# Patient Record
Sex: Male | Born: 1990 | Race: Black or African American | Hispanic: No | Marital: Single | Smoking: Never smoker
Health system: Southern US, Community
[De-identification: ages and names within clinical notes are randomized; demographics above are authoritative.]

---

## 2020-01-20 ENCOUNTER — Other Ambulatory Visit: Payer: Self-pay

## 2020-01-20 ENCOUNTER — Emergency Department: Payer: BLUE CROSS/BLUE SHIELD

## 2020-01-20 ENCOUNTER — Emergency Department
Admission: EM | Admit: 2020-01-20 | Discharge: 2020-01-20 | Disposition: A | Discharge status: Home / Self Care | Payer: BLUE CROSS/BLUE SHIELD | Attending: Emergency Medicine | Admitting: Emergency Medicine

## 2020-01-20 DIAGNOSIS — M5412 Radiculopathy, cervical region: Secondary | ICD-10-CM | POA: Diagnosis not present

## 2020-01-20 DIAGNOSIS — M25511 Pain in right shoulder: Secondary | ICD-10-CM | POA: Insufficient documentation

## 2020-01-20 MED ORDER — CYCLOBENZAPRINE HCL 10 MG PO TABS: 10.0000 mg | ORAL_TABLET | Freq: Three times a day (TID) | ORAL | 0 refills | Status: AC | PRN

## 2020-01-20 MED ORDER — METHYLPREDNISOLONE 4 MG PO TBPK: ORAL_TABLET | ORAL | 0 refills | Status: AC

## 2020-01-20 MED ORDER — LIDOCAINE 5 % EX PTCH
1.0000 | MEDICATED_PATCH | CUTANEOUS | Status: DC
  Administered 2020-01-20: 1 via TRANSDERMAL
  Filled 2020-01-20: qty 1

## 2020-01-20 MED ORDER — LIDOCAINE 5 % EX PTCH: 1.0000 | MEDICATED_PATCH | Freq: Two times a day (BID) | CUTANEOUS | 0 refills | Status: AC

## 2020-01-20 NOTE — ED Notes (Addendum)
See triage note, pt reports pain/numbness that is intermittent in right shoulder for past 2 days. Drives trucks for work. No other deficits noted. Pt in NAD at this time

## 2020-01-20 NOTE — ED Triage Notes (Signed)
Pt states he injured his right a shoulder/arm while at work 2 days ago.

## 2020-01-20 NOTE — ED Provider Notes (Signed)
[facility] Emergency Department Provider Note   ____________________________________________   First MD Initiated Contact with Patient 01/20/20 1501     (approximate)  I have reviewed the triage vital signs and the nursing notes.   HISTORY  Chief Complaint Shoulder Pain    HPI Carlos Holt is a 29 y.o. male patient complaining of right shoulder pain secondary to repetitive motion while at work 2 days ago.  Patient also stated intermitting numbness and tingling is to the right upper extremity.  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  Patient is right-hand dominant.  Patient rates his pain a 7/10.  Patient described the pain is "achy".  Patient points to the superior/ posterior aspect of the [facility] joint as a source of pain.          History reviewed. No pertinent past medical history.  There are no problems to display for this patient.   History reviewed. No pertinent surgical history.  Prior to Admission medications   Medication Sig Start Date End Date Taking? Authorizing Provider  cyclobenzaprine (FLEXERIL) 10 MG tablet Take 1 tablet (10 mg total) by mouth 3 (three) times daily as needed. 01/20/20   Joni Reining, PA-C  lidocaine (LIDODERM) 5 % Place 1 patch onto the skin every 12 (twelve) hours. Remove & Discard patch within 12 hours or as directed by MD 01/20/20 01/19/21  Joni Reining, PA-C  methylPREDNISolone (MEDROL DOSEPAK) 4 MG TBPK tablet Take Tapered dose as directed 01/20/20   Joni Reining, PA-C    Allergies Patient has no known allergies.  No family history on file.  Social History Social History   Tobacco Use  . Smoking status: Never Smoker  . Smokeless tobacco: Never Used  Substance Use Topics  . Alcohol use: Yes  . Drug use: Not Currently    Review of Systems Constitutional: No fever/chills Eyes: No visual changes. ENT: No sore  throat. Cardiovascular: Denies chest pain. Respiratory: Denies shortness of breath. Gastrointestinal: No abdominal pain.  No nausea, no vomiting.  No diarrhea.  No constipation. Genitourinary: Negative for dysuria. Musculoskeletal: Right shoulder pain.   Skin: Negative for rash. Neurological: Intermitting numbness and tingling to the right upper extremity.  ____________________________________________   PHYSICAL EXAM:  VITAL SIGNS: ED Triage Vitals  Enc Vitals Group     BP 01/20/20 1359 (!) 148/94     Pulse Rate 01/20/20 1359 99     Resp 01/20/20 1359 18     Temp 01/20/20 1359 98.7 F (37.1 C)     Temp Source 01/20/20 1359 Oral     SpO2 01/20/20 1359 100 %     Weight 01/20/20 1356 212 lb (96.2 kg)     Height 01/20/20 1356 5\' 8"  (1.727 m)     Head Circumference --      Peak Flow --      Pain Score 01/20/20 1356 7     Pain Loc --      Pain Edu? --      Excl. in GC? --    Constitutional: Alert and oriented. Well appearing and in no acute distress. Neck: No cervical spine tenderness to palpation. Hematological/Lymphatic/Immunilogical: No cervical lymphadenopathy. Cardiovascular: Normal rate, regular rhythm. Grossly normal heart sounds.  Good peripheral circulation. Respiratory: Normal respiratory effort.  No retractions. Lungs CTAB. Musculoskeletal: No obvious cervical spine deformity.  Patient full equal range of motion cervical spine.  Patient moderate guarding palpation  of L4-L6  No obvious deformity to the right shoulder.  Patient has decreased range of motion with adduction overhead reaching secondary to complaint of pain.   Neurologic:  Normal speech and language. No gross focal neurologic deficits are appreciated. No gait instability. Skin:  Skin is warm, dry and intact. No rash noted. Psychiatric: Mood and affect are normal. Speech and behavior are normal.  ____________________________________________   LABS (all labs ordered are listed, but only abnormal results are  displayed)  Labs Reviewed - No data to display ____________________________________________  EKG   ____________________________________________  RADIOLOGY  ED MD interpretation:    Official radiology report(s): DG Cervical Spine 2-3 Views  Result Date: 01/20/2020 CLINICAL DATA:  Increasing right shoulder pain EXAM: CERVICAL SPINE - 2-3 VIEW COMPARISON:  None. FINDINGS: Seventh cervical level is partially obscured on lateral view. Slight retrolisthesis of C4 on C5 is likely on a degenerative basis without abnormally widened, perched or jumped facets. No acute fracture or vertebral body height loss. No suspicious osseous lesions. No pre or paravertebral soft tissue abnormality. Airways patent. No acute abnormality in the upper chest or imaged lung apices. IMPRESSION: 1. No acute osseous abnormality. 2. Slight retrolisthesis of C4 on C5. 3. Seventh cervical level is partially obscured on lateral view. Electronically Signed   By: Lovena Le M.D.   On: 01/20/2020 15:44   DG Shoulder Right  Result Date: 01/20/2020 CLINICAL DATA:  Intermittent right shoulder pain and numbness x2 days. EXAM: RIGHT SHOULDER - 2+ VIEW COMPARISON:  None. FINDINGS: There is no evidence of fracture or dislocation. There is no evidence of arthropathy or other focal bone abnormality. Soft tissues are unremarkable. IMPRESSION: Negative. Electronically Signed   By: Virgina Norfolk M.D.   On: 01/20/2020 15:43    ____________________________________________   PROCEDURES  Procedure(s) performed (including Critical Care):  Procedures   ____________________________________________   INITIAL IMPRESSION / ASSESSMENT AND PLAN / ED COURSE  As part of my medical decision making, I reviewed the following data within the [facility]     Patient presents with 2 days of right shoulder and arm pain.  No specific incident for complaint.  Discussed right shoulder and C-spine findings with patient.  Patient  physical exam and complaint is consistent with a radicular component and repetitive motion.  Patient given discharge care instructions and advised take medication as directed.  Patient advised follow orthopedic as no improvement in 2 days.  Patient advised not to take muscle relaxers while driving or operating equipment.  Carlos Holt was evaluated in Emergency Department on 01/20/2020 for the symptoms described in the history of present illness. He was evaluated in the context of the global COVID-19 pandemic, which necessitated consideration that the patient might be at risk for infection with the SARS-CoV-2 virus that causes COVID-19. Institutional protocols and algorithms that pertain to the evaluation of patients at risk for COVID-19 are in a state of rapid change based on information released by regulatory bodies including the CDC and federal and state organizations. These policies and algorithms were followed during the patient's care in the ED.           ____________________________________________   FINAL CLINICAL IMPRESSION(S) / ED DIAGNOSES  Final diagnoses:  Acute pain of right shoulder  Cervical radiculopathy     ED Discharge Orders         Ordered    methylPREDNISolone (MEDROL DOSEPAK) 4 MG TBPK tablet     01/20/20 1604    lidocaine (LIDODERM)  5 %  Every 12 hours     01/20/20 1604    cyclobenzaprine (FLEXERIL) 10 MG tablet  3 times daily PRN     01/20/20 1604           Note:  This document was prepared using Dragon voice recognition software and may include unintentional dictation errors.    Joni Reining, PA-C 01/20/20 1613    Miguel Aschoff., MD 01/21/20 [phone]

## 2020-01-20 NOTE — Discharge Instructions (Signed)
Follow discharge care instruction take medication as directed.  Advised to follow-up orthopedic no improvement in 3 to 5 days.  Did not take muscle relaxants when driving or operating equipment.

## 2021-08-31 IMAGING — CR DG CERVICAL SPINE 2 OR 3 VIEWS
3 series · 3 of 3 positions shown · non-contrast
Comparison: None.

CLINICAL DATA: Increasing right shoulder pain

EXAM:
CERVICAL SPINE - 2-3 VIEW

[c-spine lat]
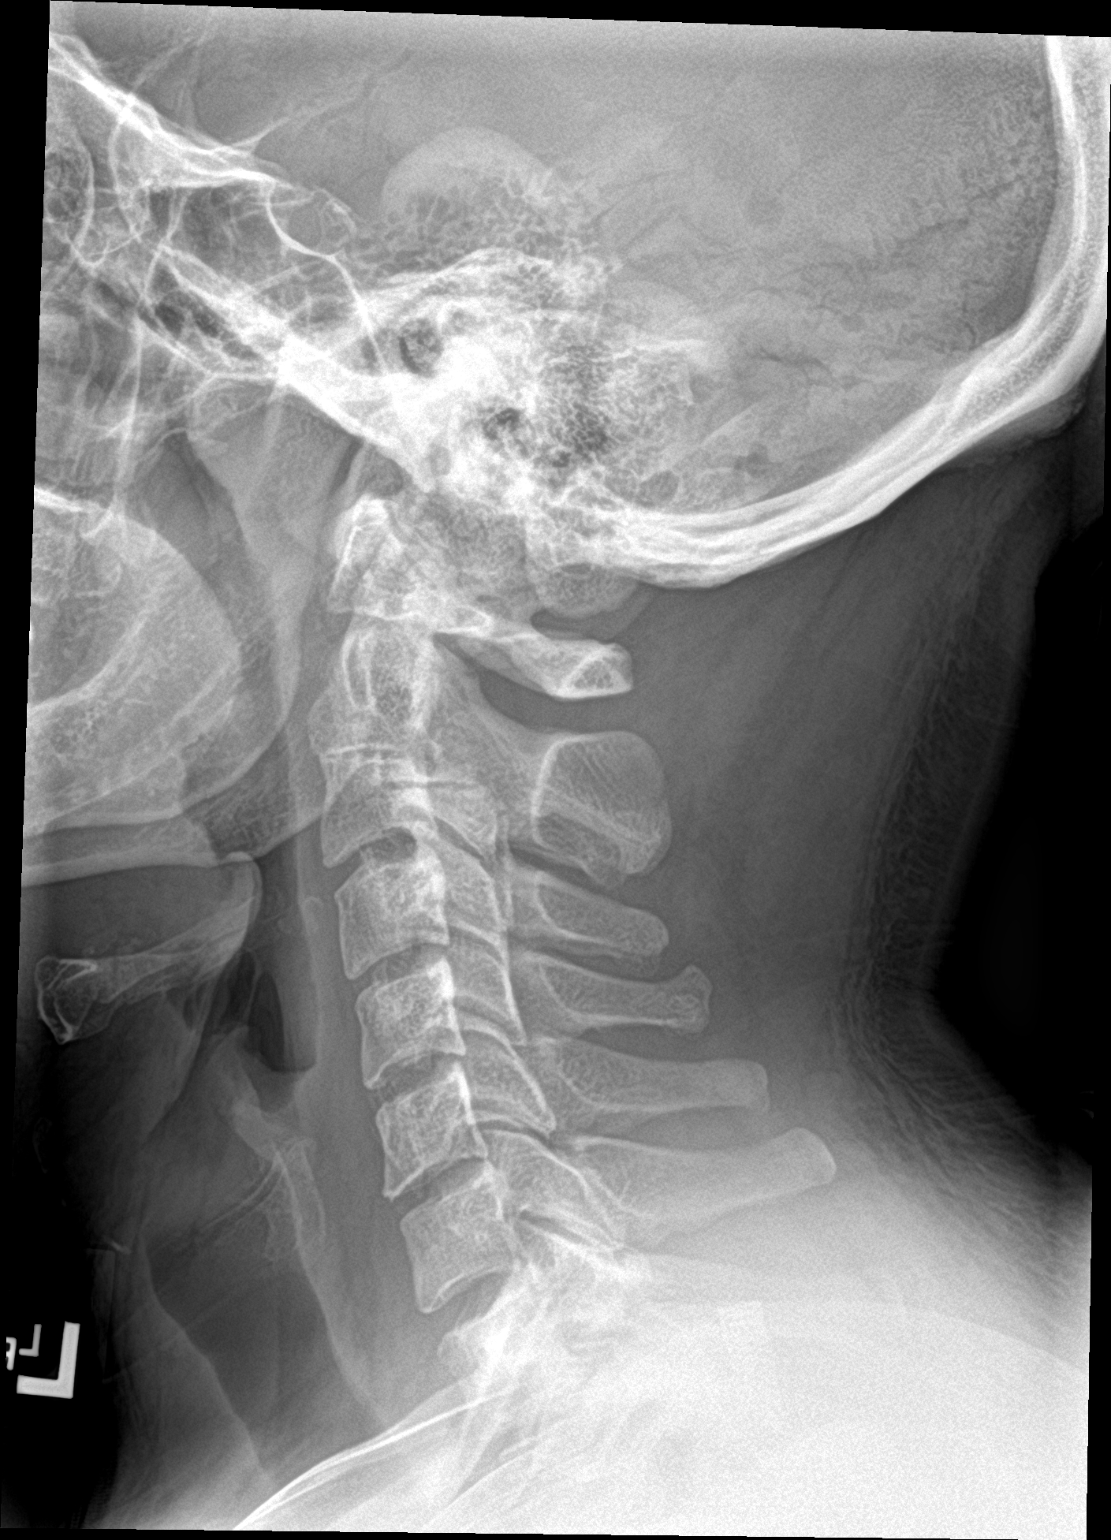

[c-spine ap]
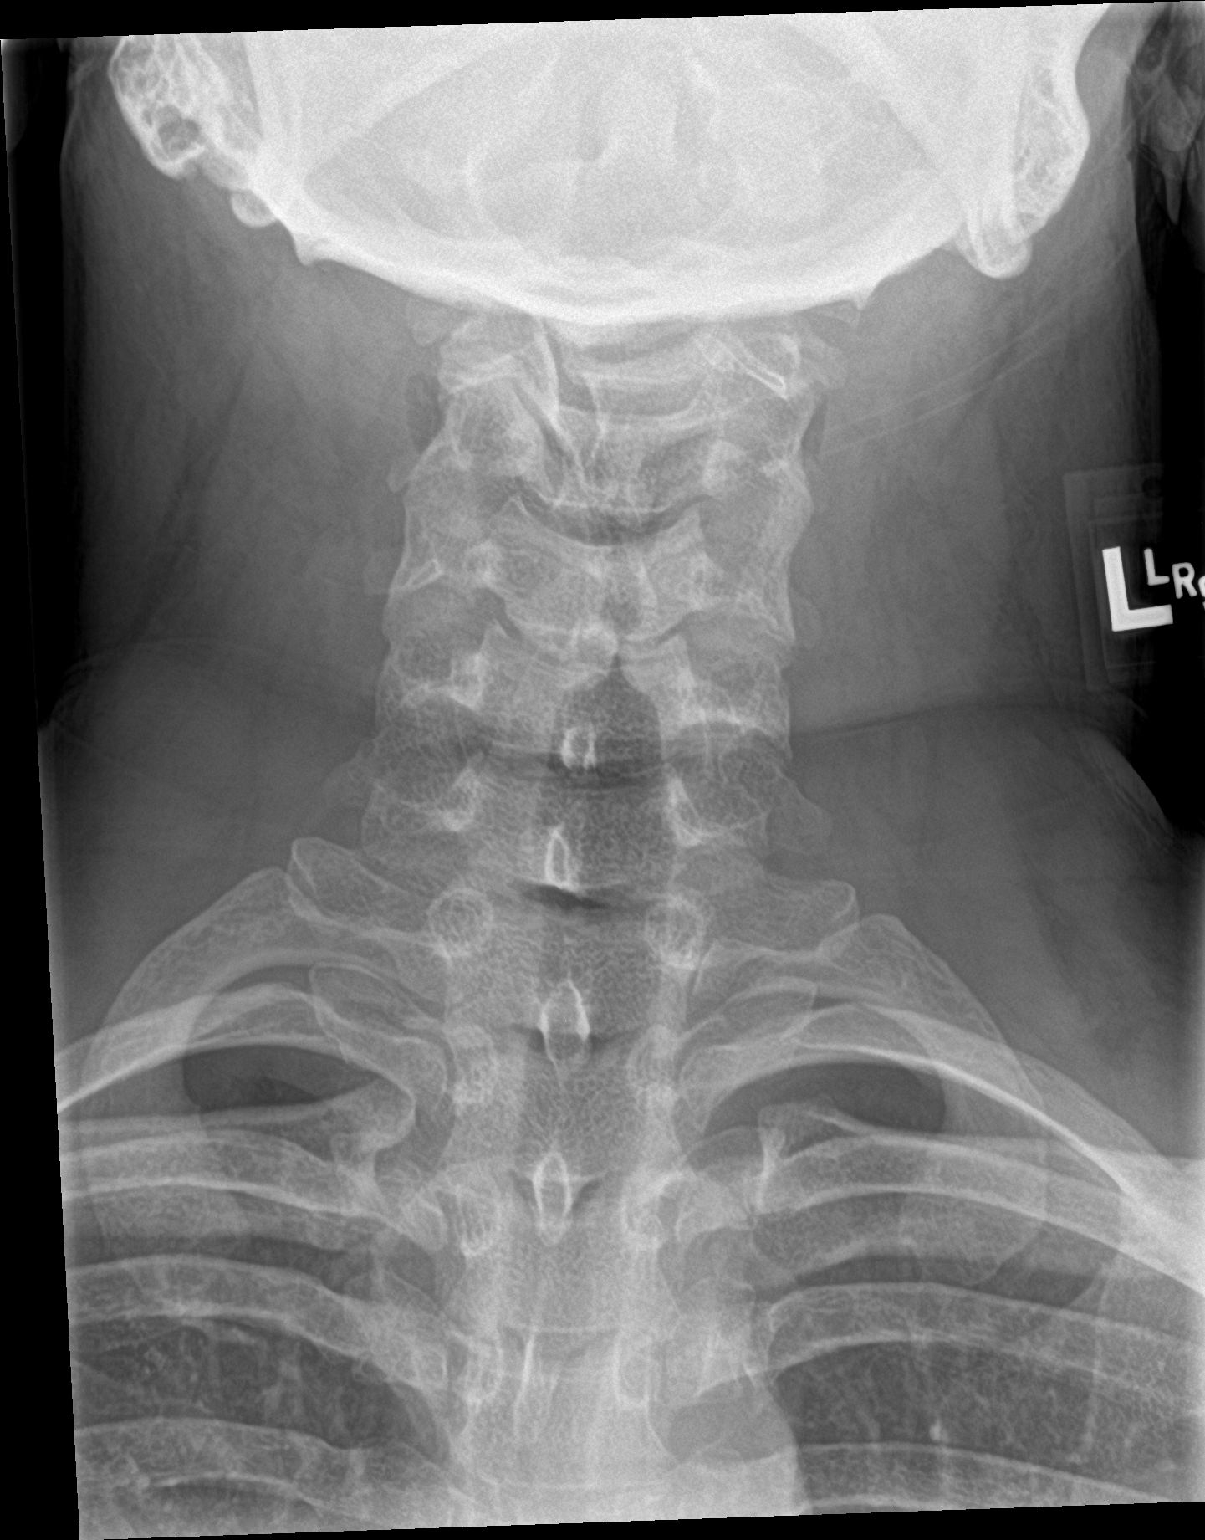

[c-spine open mouth]
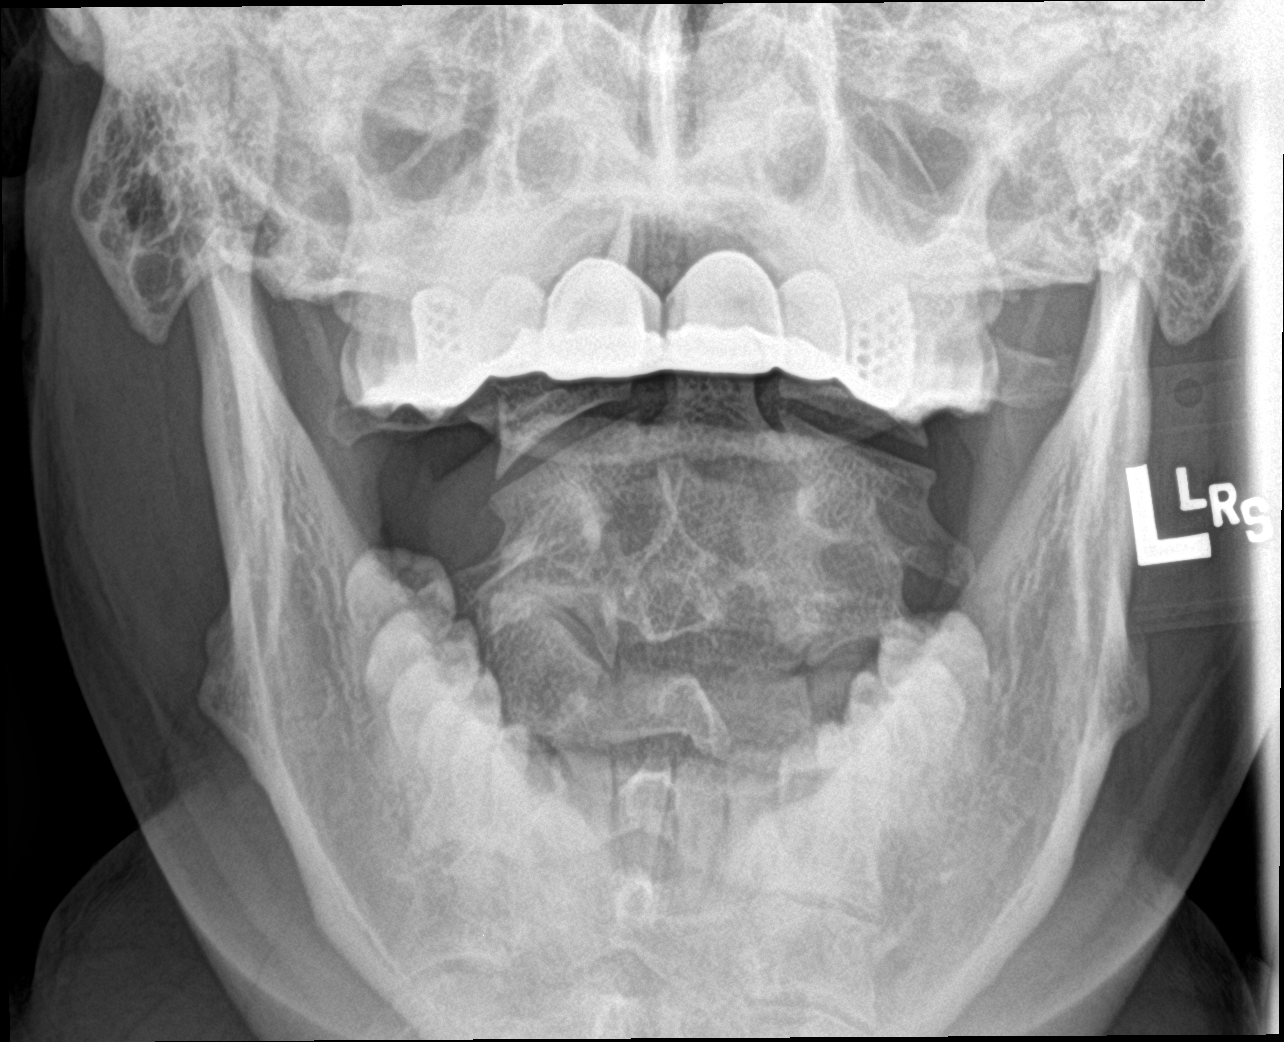

[3 of 3 positions shown; findings below may reference images not displayed]

FINDINGS: Seventh cervical level is partially obscured on lateral view. Slight
retrolisthesis of C4 on C5 is likely on a degenerative basis without
abnormally widened, perched or jumped facets. No acute fracture or
vertebral body height loss. No suspicious osseous lesions. No pre or
paravertebral soft tissue abnormality. Airways patent. No acute
abnormality in the upper chest or imaged lung apices.
IMPRESSION: 1. No acute osseous abnormality.
2. Slight retrolisthesis of C4 on C5.
3. Seventh cervical level is partially obscured on lateral view.
